# Patient Record
Sex: Female | Born: 1990 | Hispanic: No | Marital: Married | Smoking: Never smoker
Health system: Southern US, Community
[De-identification: ages and names within clinical notes are randomized; demographics above are authoritative.]

## PROBLEM LIST (undated history)

## (undated) DIAGNOSIS — E559 Vitamin D deficiency, unspecified: Secondary | ICD-10-CM

## (undated) DIAGNOSIS — R7303 Prediabetes: Secondary | ICD-10-CM

## (undated) DIAGNOSIS — E7800 Pure hypercholesterolemia, unspecified: Secondary | ICD-10-CM

## (undated) DIAGNOSIS — J45909 Unspecified asthma, uncomplicated: Secondary | ICD-10-CM

## (undated) HISTORY — DX: Pure hypercholesterolemia, unspecified: E78.00

## (undated) HISTORY — DX: Prediabetes: R73.03

## (undated) HISTORY — DX: Vitamin D deficiency, unspecified: E55.9

## (undated) HISTORY — DX: Unspecified asthma, uncomplicated: J45.909

---

## 2015-02-07 ENCOUNTER — Ambulatory Visit: Payer: 59 | Admitting: Family Medicine

## 2015-03-19 ENCOUNTER — Ambulatory Visit: Payer: 59 | Admitting: Family Medicine

## 2015-03-29 ENCOUNTER — Encounter: Payer: Self-pay | Admitting: Family Medicine

## 2015-03-29 ENCOUNTER — Ambulatory Visit (INDEPENDENT_AMBULATORY_CARE_PROVIDER_SITE_OTHER): Payer: 59 | Admitting: Family Medicine

## 2015-03-29 VITALS — BP 123/76 | HR 87 | Temp 98.0°F | Resp 16 | Ht 62.0 in | Wt 156.0 lb

## 2015-03-29 DIAGNOSIS — Z0000 Encounter for general adult medical examination without abnormal findings: Secondary | ICD-10-CM | POA: Diagnosis not present

## 2015-03-29 DIAGNOSIS — E669 Obesity, unspecified: Secondary | ICD-10-CM

## 2015-03-29 LAB — CBC WITH DIFFERENTIAL/PLATELET
Basophils Absolute: 0 10*3/uL (ref 0.0–0.1)
Basophils Relative: 0 % (ref 0–1)
EOS PCT: 1 % (ref 0–5)
Eosinophils Absolute: 0.1 10*3/uL (ref 0.0–0.7)
HEMATOCRIT: 39.7 % (ref 36.0–46.0)
HEMOGLOBIN: 13.4 g/dL (ref 12.0–15.0)
LYMPHS ABS: 4 10*3/uL (ref 0.7–4.0)
LYMPHS PCT: 34 % (ref 12–46)
MCH: 27.7 pg (ref 26.0–34.0)
MCHC: 33.8 g/dL (ref 30.0–36.0)
MCV: 82.2 fL (ref 78.0–100.0)
MONO ABS: 0.7 10*3/uL (ref 0.1–1.0)
MONOS PCT: 6 % (ref 3–12)
MPV: 8.9 fL (ref 8.6–12.4)
Neutro Abs: 7 10*3/uL (ref 1.7–7.7)
Neutrophils Relative %: 59 % (ref 43–77)
Platelets: 482 10*3/uL — ABNORMAL HIGH (ref 150–400)
RBC: 4.83 MIL/uL (ref 3.87–5.11)
RDW: 13.4 % (ref 11.5–15.5)
WBC: 11.8 10*3/uL — AB (ref 4.0–10.5)

## 2015-03-29 LAB — COMPLETE METABOLIC PANEL WITH GFR
ALT: 48 U/L — AB (ref 6–29)
AST: 31 U/L — AB (ref 10–30)
Albumin: 4.2 g/dL (ref 3.6–5.1)
Alkaline Phosphatase: 84 U/L (ref 33–115)
BUN: 6 mg/dL — AB (ref 7–25)
CALCIUM: 9.2 mg/dL (ref 8.6–10.2)
CHLORIDE: 103 mmol/L (ref 98–110)
CO2: 24 mmol/L (ref 20–31)
CREATININE: 0.69 mg/dL (ref 0.50–1.10)
GFR, Est African American: >89 mL/min (ref >=60)
GFR, Est Non African American: >89 mL/min (ref >=60)
GLUCOSE: 96 mg/dL (ref 65–99)
Potassium: 4.2 mmol/L (ref 3.5–5.3)
SODIUM: 139 mmol/L (ref 135–146)
Total Bilirubin: 0.4 mg/dL (ref 0.2–1.2)
Total Protein: 6.6 g/dL (ref 6.1–8.1)

## 2015-03-29 LAB — TSH: TSH: 2.929 u[IU]/mL (ref 0.350–4.500)

## 2015-03-29 NOTE — Progress Notes (Signed)
Patient ID: Misty Stevens, female   DOB: [birth year], 24 y.o.   MRN: [id number]   Misty Stevens, is a 23 y.o. female  [id number]  [id number]  DOB - 1990-10-28  CC:  Chief Complaint  Patient presents with  . Establish Care       HPI: Misty Stevens is a 24 y.o. female here to establish care. She denies chronic illness. Her only medication is OTC fish oil. She reports never being sexually active and has never had a PAP smear or breast exam at 24. There is no FH of early breast cancer. She has never smoked, has rare alchol and does not use illicit drugs.   No Known Allergies History reviewed. No pertinent past medical history. No current outpatient prescriptions on file prior to visit.   No current facility-administered medications on file prior to visit.   Family History  Problem Relation Age of Onset  . Diabetes Mother   . Hypertension Mother    Social History   Social History  . Marital Status: Single    Spouse Name: N/A  . Number of Children: N/A  . Years of Education: N/A   Occupational History  . Not on file.   Social History Main Topics  . Smoking status: Never Smoker   . Smokeless tobacco: Never Used  . Alcohol Use: No  . Drug Use: No  . Sexual Activity: Yes   Other Topics Concern  . Not on file   Social History Narrative  . No narrative on file    Review of Systems: Constitutional: Negative for fever, chills, appetite change, weight loss,  Fatigue. Skin: Negative for rashes or lesions of concern. HENT: Negative for ear pain, ear discharge.nose bleeds Eyes: Negative for pain, discharge, redness, itching and visual disturbance. Neck: Negative for pain, stiffness Respiratory: Negative for cough, shortness of breath,   Cardiovascular: Negative for chest pain, palpitations and leg swelling. Gastrointestinal: Negative for abdominal pain, nausea, vomiting, diarrhea, constipations Genitourinary: Negative for dysuria, urgency, frequency, hematuria,   Musculoskeletal: Negative for back pain, joint pain, joint  swelling, and gait problem.Negative for weakness. Neurological: Negative for dizziness, tremors, seizures, syncope,   light-headedness, numbness and headaches.  Psychiatric/Behavioral: Negative for depression, anxiety, decreased concentration, confusion   Objective:   Filed Vitals:   03/29/15 1330  BP: 123/76  Pulse: 87  Temp: 98 F (36.7 C)  Resp: 16    Physical Exam: Constitutional: Patient appears well-developed and well-nourished. No distress. HENT: Normocephalic, atraumatic, External right and left ear normal. Oropharynx is clear and moist.  Eyes: Conjunctivae and EOM are normal. PERRLA, no scleral icterus. Neck: Normal ROM. Neck supple. No lymphadenopathy, No thyromegaly. CVS: RRR, S1/S2 +, no murmurs, no gallops, no rubs Pulmonary: Effort and breath sounds normal, no stridor, rhonchi, wheezes, rales.  Abdominal: Soft. Normoactive BS,, no distension, tenderness, rebound or guarding.  Musculoskeletal: Normal range of motion. No edema and no tenderness.  Neuro: Alert.Normal muscle tone coordination. Non-focal Skin: Skin is warm and dry. No rash noted. Not diaphoretic. No erythema. No pallor. Psychiatric: Normal mood and affect. Behavior, judgment, thought content normal.  No results found for: WBC, HGB, HCT, MCV, PLT No results found for: CREATININE, BUN, NA, K, CL, CO2  No results found for: HGBA1C Lipid Panel  No results found for: CHOL, TRIG, HDL, CHOLHDL, VLDL, LDLCALC     Assessment and plan:   1. Obesity  - TSH  2. Health care maintenance  - COMPLETE METABOLIC PANEL WITH GFR - CBC with Differential - TSH -  Vitamin D 1,25 dihydroxy  In the near future for PAP smear and breast exam.   The patient was given clear instructions to go to ER or return to medical center if symptoms don't improve, worsen or new problems develop. The patient verbalized understanding.    Henrietta Hoover  FNP  03/29/2015, 2:07 PM

## 2015-03-29 NOTE — Patient Instructions (Signed)

## 2015-04-01 LAB — VITAMIN D 1,25 DIHYDROXY
Vitamin D 1, 25 (OH)2 Total: 61 pg/mL (ref 18–72)
Vitamin D2 1, 25 (OH)2: <8 pg/mL
Vitamin D3 1, 25 (OH)2: 61 pg/mL

## 2015-04-12 ENCOUNTER — Ambulatory Visit (INDEPENDENT_AMBULATORY_CARE_PROVIDER_SITE_OTHER): Payer: 59 | Admitting: Family Medicine

## 2015-04-12 VITALS — BP 119/84 | HR 73 | Temp 97.9°F | Resp 16 | Ht 62.0 in | Wt 155.0 lb

## 2015-04-12 DIAGNOSIS — Z0000 Encounter for general adult medical examination without abnormal findings: Secondary | ICD-10-CM

## 2015-05-31 NOTE — Progress Notes (Signed)
This visit was cancelled. Patient not seen

## 2015-07-04 ENCOUNTER — Ambulatory Visit: Payer: 59 | Admitting: Family Medicine

## 2016-03-19 ENCOUNTER — Ambulatory Visit
Admission: RE | Admit: 2016-03-19 | Discharge: 2016-03-19 | Disposition: A | Discharge status: Home / Self Care | Payer: No Typology Code available for payment source | Source: Ambulatory Visit | Attending: Cardiovascular Disease | Admitting: Cardiovascular Disease

## 2016-03-19 ENCOUNTER — Other Ambulatory Visit: Payer: Self-pay | Admitting: Cardiovascular Disease

## 2016-03-19 DIAGNOSIS — R0602 Shortness of breath: Secondary | ICD-10-CM

## 2016-03-19 DIAGNOSIS — R0902 Hypoxemia: Secondary | ICD-10-CM

## 2016-03-19 IMAGING — CT CT ANGIO CHEST
1 of 2 series · 18 of 30 positions shown · IV contrast (APPLIED)
Comparison: None.

ADDENDUM:
These results were called by telephone at the time of interpretation
on [DATE] at [DATE] to Dr. DESABOLLADURA , who verbally
acknowledged these results.
CLINICAL DATA: Shortness of breath for 1 week, worse today,
decreased O2 sats. History of long car trip [REDACTED].

EXAM:
CT ANGIOGRAPHY CHEST WITH CONTRAST
TECHNIQUE: Multidetector CT imaging of the chest was performed using the
standard protocol during bolus administration of intravenous
contrast. Multiplanar CT image reconstructions and MIPs were
obtained to evaluate the vascular anatomy.
CONTRAST:  80 cc Isovue 370

[Series 7: thins 1.5 b31s · axial · 0.79mm/px · z∈[-302,-53]mm · 18 of 188 slices shown]
[im 11/188  lung]
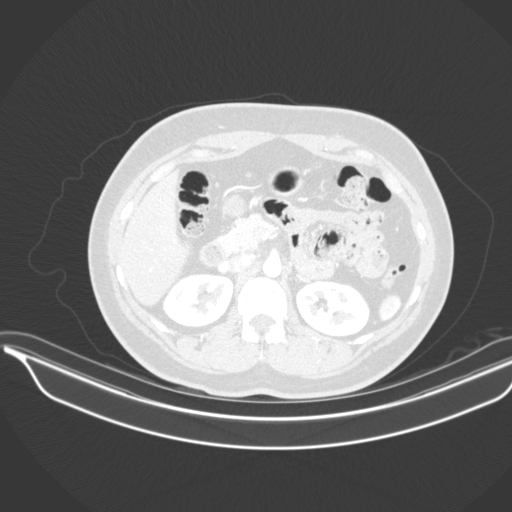
[im 21/188  mediastinal]
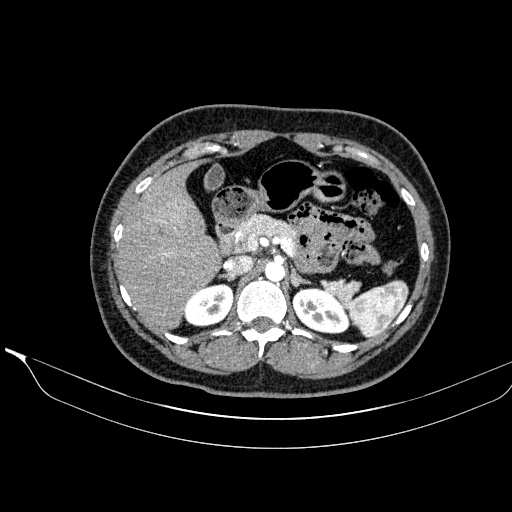
[im 32/188  lung]
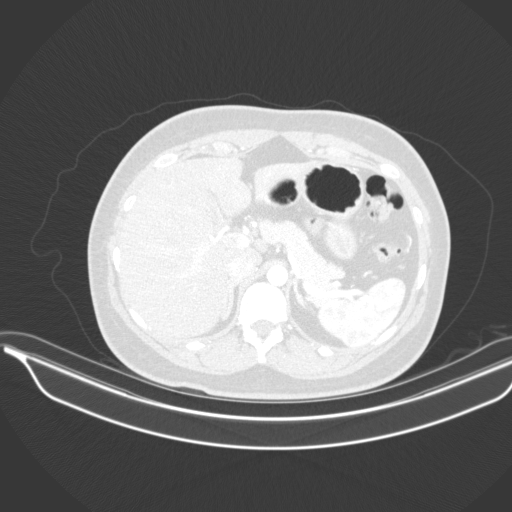
[im 42/188  mediastinal]
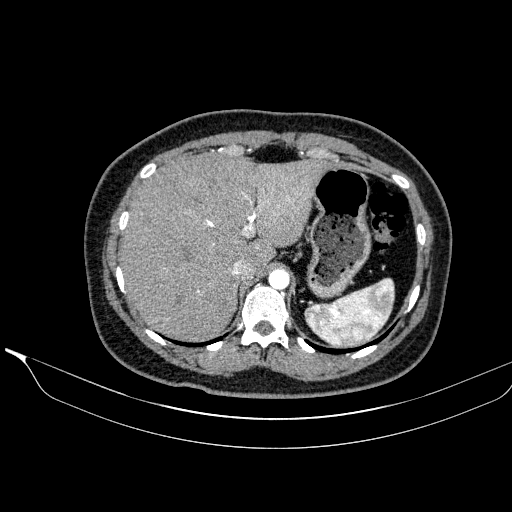
[im 52/188  lung]
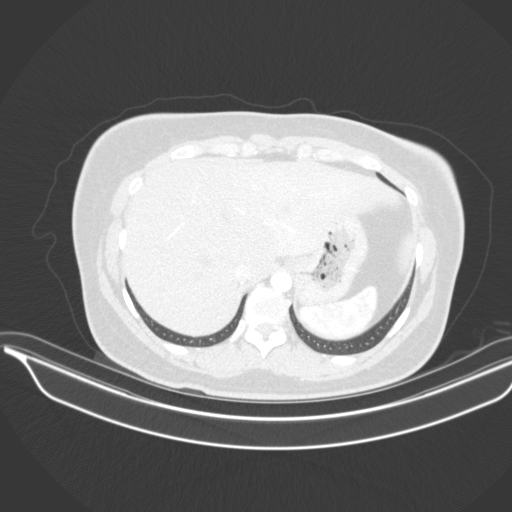
[im 63/188  mediastinal]
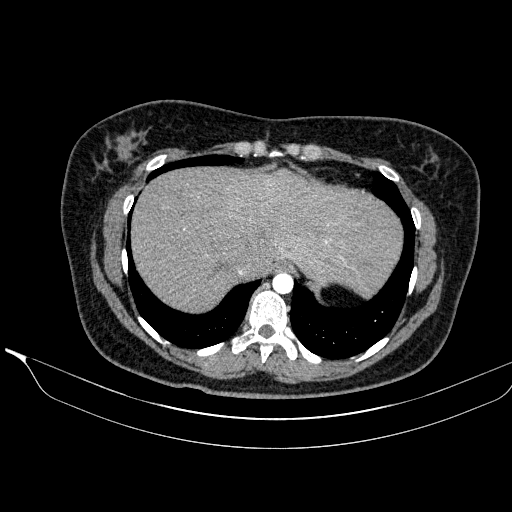
[im 73/188  lung]
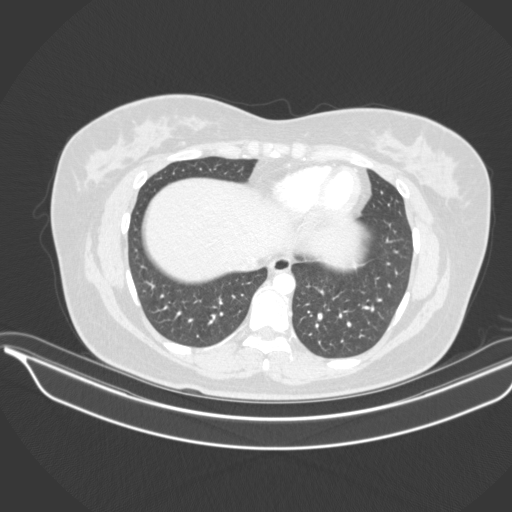
[im 84/188  mediastinal]
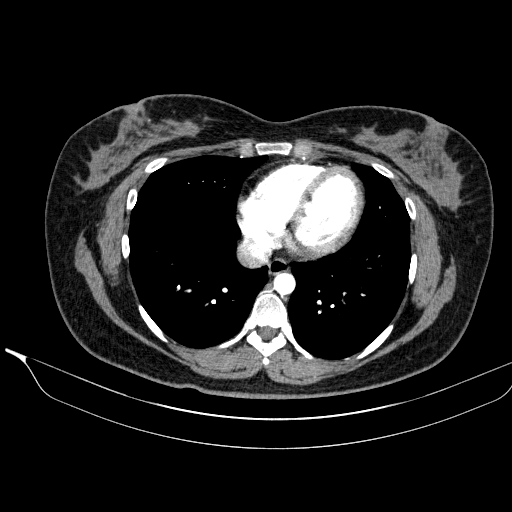
[im 88/188  lung]
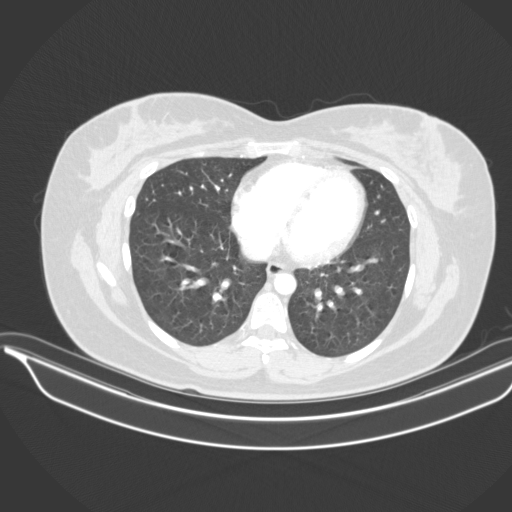
[im 94/188  mediastinal]
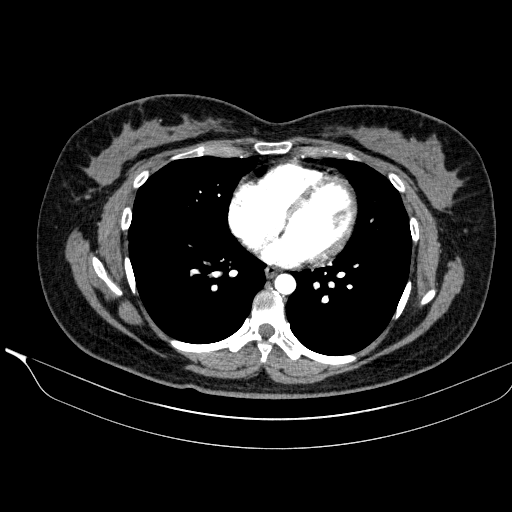
[im 104/188  lung]
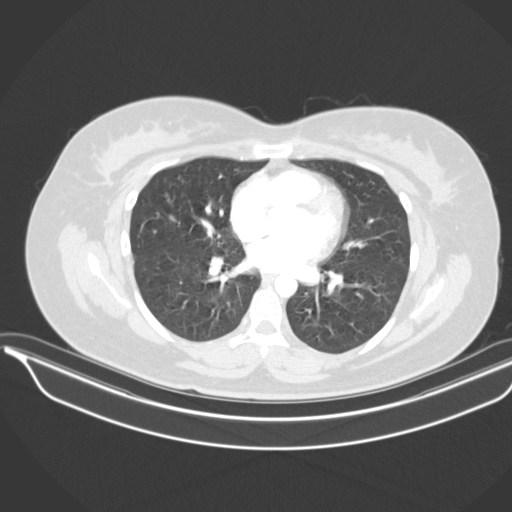
[im 115/188  mediastinal]
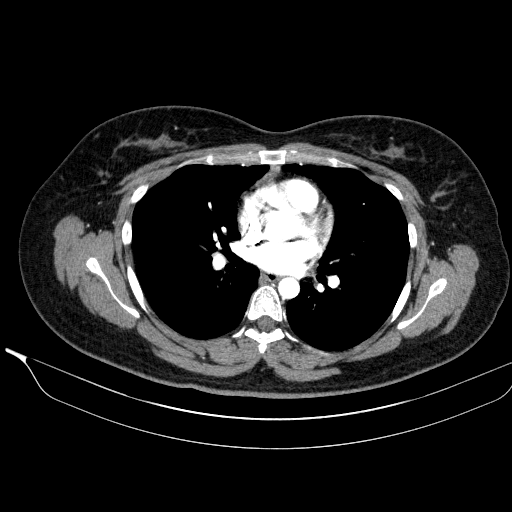
[im 125/188  lung]
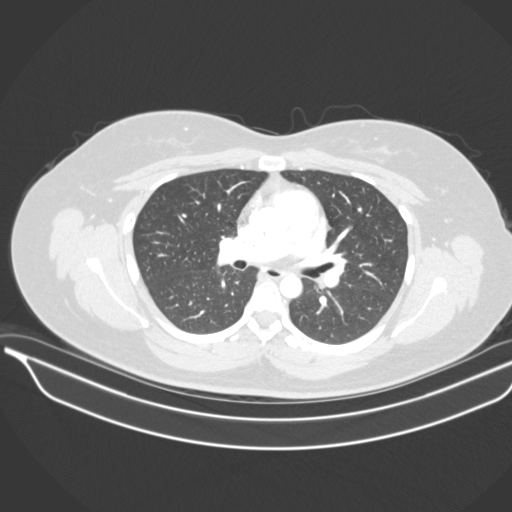
[im 136/188  mediastinal]
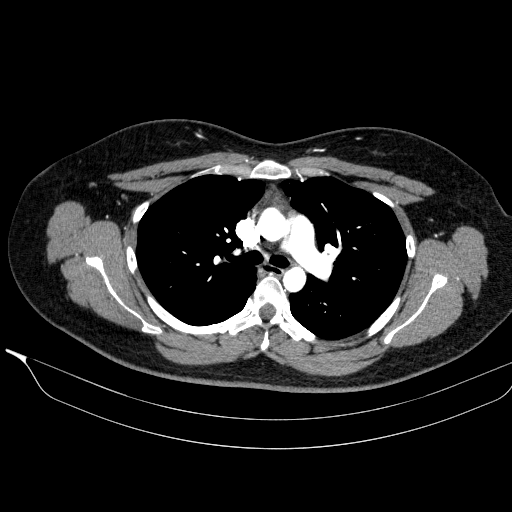
[im 146/188  lung]
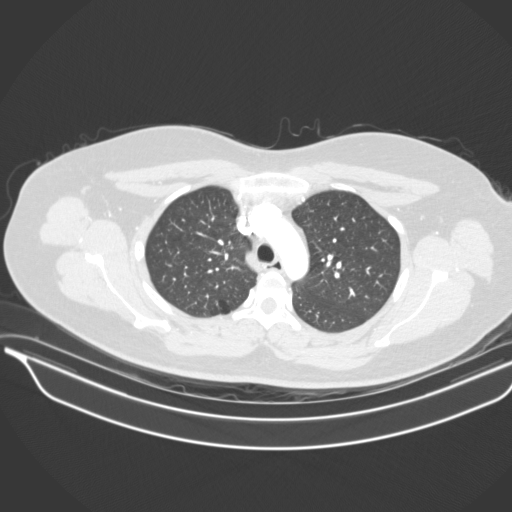
[im 156/188  mediastinal]
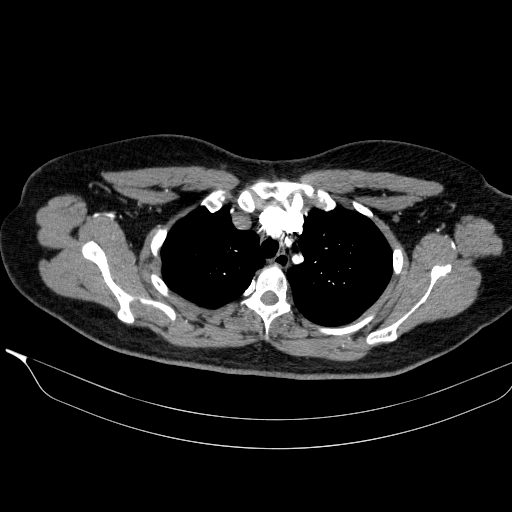
[im 167/188  lung]
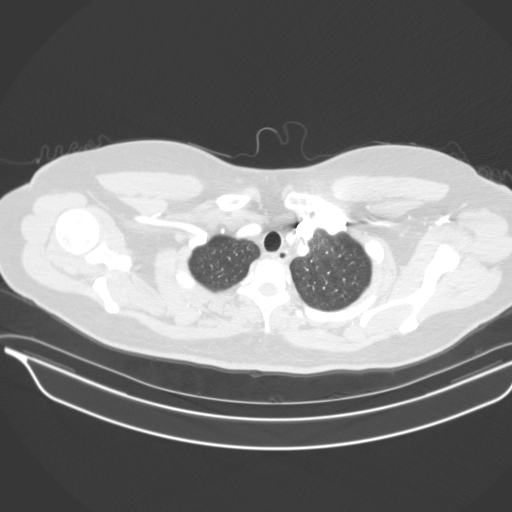
[im 177/188  mediastinal]
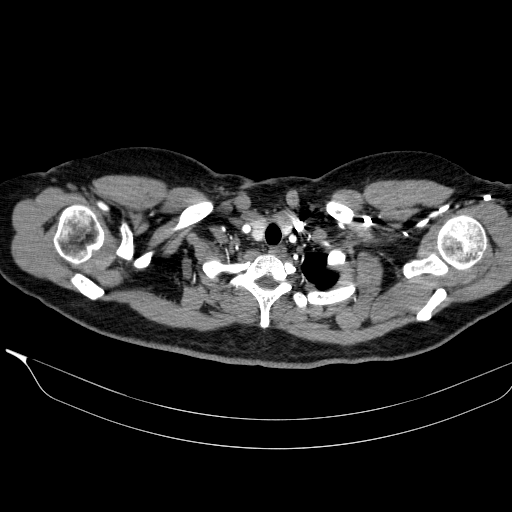

[18 of 30 positions shown; findings below may reference images not displayed]

FINDINGS: Mediastinum/Lymph Nodes: Some of the most peripheral subsegmental
pulmonary artery branches are difficult to definitively characterize
due to patient breathing motion artifact but there is no pulmonary
embolism seen within the main, lobar or central segmental pulmonary
arteries bilaterally.

Thoracic aorta is normal in caliber and configuration. No aortic
aneurysm or dissection. Heart size is normal. No pericardial
effusion.

No mass or enlarged lymph nodes within the mediastinum or perihilar
regions. Normal residual thymic tissue noted in the anterior
mediastinum. Trachea and central bronchi are unremarkable.

Lungs/Pleura: No pulmonary mass, infiltrate, or effusion.

Upper abdomen: Limited images of the upper abdomen are unremarkable.

Musculoskeletal: There is a compression deformity of the T9
vertebral body which appears chronic, with probable additional
Schmorl's node anteriorly. No acute-appearing osseous abnormality.
Superficial soft tissues are unremarkable.

Review of the MIP images confirms the above findings.
IMPRESSION: 1. No pulmonary embolism, with mild study limitations detailed
above.
2. Overall, no acute findings. Lungs are clear. Heart size is
normal.
3. Compression deformity of the T9 vertebral body which appears
chronic, approximately 30% compressed anteriorly, with associated
mild thoracic kyphosis. Any recent trauma or back pain? No
retropulsion of the vertebral body. No evidence of acute osseous
abnormality.

## 2016-03-19 MED ORDER — IOPAMIDOL (ISOVUE-370) INJECTION 76%
80.0000 mL | Freq: Once | INTRAVENOUS | Status: AC | PRN
  Administered 2016-03-19: 80 mL via INTRAVENOUS

## 2018-01-05 ENCOUNTER — Other Ambulatory Visit (HOSPITAL_COMMUNITY)
Admission: RE | Admit: 2018-01-05 | Discharge: 2018-01-05 | Disposition: A | Discharge status: Home / Self Care | Payer: BLUE CROSS/BLUE SHIELD | Source: Ambulatory Visit | Attending: Obstetrics and Gynecology | Admitting: Obstetrics and Gynecology

## 2018-01-05 ENCOUNTER — Other Ambulatory Visit: Payer: Self-pay

## 2018-01-05 ENCOUNTER — Ambulatory Visit: Payer: BLUE CROSS/BLUE SHIELD | Admitting: Obstetrics and Gynecology

## 2018-01-05 ENCOUNTER — Encounter: Payer: Self-pay | Admitting: Obstetrics and Gynecology

## 2018-01-05 VITALS — BP 100/62 | HR 70 | Resp 16 | Ht 62.75 in | Wt 145.4 lb

## 2018-01-05 DIAGNOSIS — Z01419 Encounter for gynecological examination (general) (routine) without abnormal findings: Secondary | ICD-10-CM

## 2018-01-05 DIAGNOSIS — Z124 Encounter for screening for malignant neoplasm of cervix: Secondary | ICD-10-CM | POA: Diagnosis not present

## 2018-01-05 DIAGNOSIS — Z7189 Other specified counseling: Secondary | ICD-10-CM

## 2018-01-05 DIAGNOSIS — Z23 Encounter for immunization: Secondary | ICD-10-CM

## 2018-01-05 DIAGNOSIS — N941 Unspecified dyspareunia: Secondary | ICD-10-CM | POA: Diagnosis not present

## 2018-01-05 DIAGNOSIS — Z7185 Encounter for immunization safety counseling: Secondary | ICD-10-CM

## 2018-01-05 MED ORDER — LIDOCAINE 5 % EX OINT: TOPICAL_OINTMENT | CUTANEOUS | 1 refills | Status: DC

## 2018-01-05 NOTE — Patient Instructions (Addendum)
Try lidocaine, lubrication and you control rate and depth of penetration. You can look into vaginal dilators  EXERCISE AND DIET:  We recommended that you start or continue a regular exercise program for good health. Regular exercise means any activity that makes your heart beat faster and makes you sweat.  We recommend exercising at least 30 minutes per day at least 3 days a week, preferably 4 or 5.  We also recommend a diet low in fat and sugar.  Inactivity, poor dietary choices and obesity can cause diabetes, heart attack, stroke, and kidney damage, among others.    ALCOHOL AND SMOKING:  Women should limit their alcohol intake to no more than 7 drinks/beers/glasses of wine (combined, not each!) per week. Moderation of alcohol intake to this level decreases your risk of breast cancer and liver damage. And of course, no recreational drugs are part of a healthy lifestyle.  And absolutely no smoking or even second hand smoke. Most people know smoking can cause heart and lung diseases, but did you know it also contributes to weakening of your bones? Aging of your skin?  Yellowing of your teeth and nails?  CALCIUM AND VITAMIN D:  Adequate intake of calcium and Vitamin D are recommended.  The recommendations for exact amounts of these supplements seem to change often, but generally speaking 600 mg of calcium (either carbonate or citrate) and 800 units of Vitamin D per day seems prudent. Certain women may benefit from higher intake of Vitamin D.  If you are among these women, your doctor will have told you during your visit.    PAP SMEARS:  Pap smears, to check for cervical cancer or precancers,  have traditionally been done yearly, although recent scientific advances have shown that most women can have pap smears less often.  However, every woman still should have a physical exam from her gynecologist every year. It will include a breast check, inspection of the vulva and vagina to check for abnormal growths or  skin changes, a visual exam of the cervix, and then an exam to evaluate the size and shape of the uterus and ovaries.  And after 29 years of age, a rectal exam is indicated to check for rectal cancers. We will also provide age appropriate advice regarding health maintenance, like when you should have certain vaccines, screening for sexually transmitted diseases, bone density testing, colonoscopy, mammograms, etc.   MAMMOGRAMS:  All women over 32 years old should have a yearly mammogram. Many facilities now offer a "3D" mammogram, which may cost around $50 extra out of pocket. If possible,  we recommend you accept the option to have the 3D mammogram performed.  It both reduces the number of women who will be called back for extra views which then turn out to be normal, and it is better than the routine mammogram at detecting truly abnormal areas.    COLONOSCOPY:  Colonoscopy to screen for colon cancer is recommended for all women at age 20.  We know, you hate the idea of the prep.  We agree, BUT, having colon cancer and not knowing it is worse!!  Colon cancer so often starts as a polyp that can be seen and removed at colonscopy, which can quite literally save your life!  And if your first colonoscopy is normal and you have no family history of colon cancer, most women don't have to have it again for 10 years.  Once every ten years, you can do something that may end up saving  your life, right?  We will be happy to help you get it scheduled when you are ready.  Be sure to check your insurance coverage so you understand how much it will cost.  It may be covered as a preventative service at no cost, but you should check your particular policy.       Breast Self-Awareness Breast self-awareness means being familiar with how your breasts look and feel. It involves checking your breasts regularly and reporting any changes to your health care provider. Practicing breast self-awareness is important. A change in your  breasts can be a sign of a serious medical problem. Being familiar with how your breasts look and feel allows you to find any problems early, when treatment is more likely to be successful. All women should practice breast self-awareness, including women who have had breast implants. How to do a breast self-exam One way to learn what is normal for your breasts and whether your breasts are changing is to do a breast self-exam. To do a breast self-exam: Look for Changes  1. Remove all the clothing above your waist. 2. Stand in front of a mirror in a room with good lighting. 3. Put your hands on your hips. 4. Push your hands firmly downward. 5. Compare your breasts in the mirror. Look for differences between them (asymmetry), such as: ? Differences in shape. ? Differences in size. ? Puckers, dips, and bumps in one breast and not the other. 6. Look at each breast for changes in your skin, such as: ? Redness. ? Scaly areas. 7. Look for changes in your nipples, such as: ? Discharge. ? Bleeding. ? Dimpling. ? Redness. ? A change in position. Feel for Changes  Carefully feel your breasts for lumps and changes. It is best to do this while lying on your back on the floor and again while sitting or standing in the shower or tub with soapy water on your skin. Feel each breast in the following way:  Place the arm on the side of the breast you are examining above your head.  Feel your breast with the other hand.  Start in the nipple area and make  inch (2 cm) overlapping circles to feel your breast. Use the pads of your three middle fingers to do this. Apply light pressure, then medium pressure, then firm pressure. The light pressure will allow you to feel the tissue closest to the skin. The medium pressure will allow you to feel the tissue that is a little deeper. The firm pressure will allow you to feel the tissue close to the ribs.  Continue the overlapping circles, moving downward over the  breast until you feel your ribs below your breast.  Move one finger-width toward the center of the body. Continue to use the  inch (2 cm) overlapping circles to feel your breast as you move slowly up toward your collarbone.  Continue the up and down exam using all three pressures until you reach your armpit.  Write Down What You Find  Write down what is normal for each breast and any changes that you find. Keep a written record with breast changes or normal findings for each breast. By writing this information down, you do not need to depend only on memory for size, tenderness, or location. Write down where you are in your menstrual cycle, if you are still menstruating. If you are having trouble noticing differences in your breasts, do not get discouraged. With time you will become more familiar  with the variations in your breasts and more comfortable with the exam. How often should I examine my breasts? Examine your breasts every month. If you are breastfeeding, the best time to examine your breasts is after a feeding or after using a breast pump. If you menstruate, the best time to examine your breasts is 5-7 days after your period is over. During your period, your breasts are lumpier, and it may be more difficult to notice changes. When should I see my health care provider? See your health care provider if you notice:  A change in shape or size of your breasts or nipples.  A change in the skin of your breast or nipples, such as a reddened or scaly area.  Unusual discharge from your nipples.  A lump or thick area that was not there before.  Pain in your breasts.  Anything that concerns you.  This information is not intended to replace advice given to you by your health care provider. Make sure you discuss any questions you have with your health care provider. Document Released: 07/20/2005 Document Revised: 12/26/2015 Document Reviewed: 06/09/2015 Elsevier Interactive Patient Education   2018 [employer].  Oral Contraception Use Oral contraceptive pills (OCPs) are medicines taken to prevent pregnancy. OCPs work by preventing the ovaries from releasing eggs. The hormones in OCPs also cause the cervical mucus to thicken, preventing the sperm from entering the uterus. The hormones also cause the uterine lining to become thin, not allowing a fertilized egg to attach to the inside of the uterus. OCPs are highly effective when taken exactly as prescribed. However, OCPs do not prevent sexually transmitted diseases (STDs). Safe sex practices, such as using condoms along with an OCP, can help prevent STDs. Before taking OCPs, you may have a physical exam and Pap test. Your health care provider may also order blood tests if necessary. Your health care provider will make sure you are a good candidate for oral contraception. Discuss with your health care provider the possible side effects of the OCP you may be prescribed. When starting an OCP, it can take 2 to 3 months for the body to adjust to the changes in hormone levels in your body. How to take oral contraceptive pills Your health care provider may advise you on how to start taking the first cycle of OCPs. Otherwise, you can:  Start on day 1 of your menstrual period. You will not need any backup contraceptive protection with this start time.  Start on the first Sunday after your menstrual period or the day you get your prescription. In these cases, you will need to use backup contraceptive protection for the first week.  Start the pill at any time of your cycle. If you take the pill within 5 days of the start of your period, you are protected against pregnancy right away. In this case, you will not need a backup form of birth control. If you start at any other time of your menstrual cycle, you will need to use another form of birth control for 7 days. If your OCP is the type called a minipill, it will protect you from pregnancy after taking  it for 2 days (48 hours).  After you have started taking OCPs:  If you forget to take 1 pill, take it as soon as you remember. Take the next pill at the regular time.  If you miss 2 or more pills, call your health care provider because different pills have different instructions for missed doses.  Use backup birth control until your next menstrual period starts.  If you use a 28-day pack that contains inactive pills and you miss 1 of the last 7 pills (pills with no hormones), it will not matter. Throw away the rest of the non-hormone pills and start a new pill pack.  No matter which day you start the OCP, you will always start a new pack on that same day of the week. Have an extra pack of OCPs and a backup contraceptive method available in case you miss some pills or lose your OCP pack. Follow these instructions at home:  Do not smoke.  Always use a condom to protect against STDs. OCPs do not protect against STDs.  Use a calendar to mark your menstrual period days.  Read the information and directions that came with your OCP. Talk to your health care provider if you have questions. Contact a health care provider if:  You develop nausea and vomiting.  You have abnormal vaginal discharge or bleeding.  You develop a rash.  You miss your menstrual period.  You are losing your hair.  You need treatment for mood swings or depression.  You get dizzy when taking the OCP.  You develop acne from taking the OCP.  You become pregnant. Get help right away if:  You develop chest pain.  You develop shortness of breath.  You have an uncontrolled or severe headache.  You develop numbness or slurred speech.  You develop visual problems.  You develop pain, redness, and swelling in the legs. This information is not intended to replace advice given to you by your health care provider. Make sure you discuss any questions you have with your health care provider. Document Released:  07/09/2011 Document Revised: 12/26/2015 Document Reviewed: 01/08/2013 Elsevier Interactive Patient Education  2017 [employer]

## 2018-01-05 NOTE — Progress Notes (Signed)
27 y.o. G0P0000 SingleAsianF here for annual exam.   Period Cycle (Days): 30 Period Duration (Days): 6-7 days Period Pattern: Regular Menstrual Control: Maxi pad Menstrual Control Change Freq (Hours): every 6-8 hours on heaviest day Dysmenorrhea: None  Never sexually active, just got married, has tried to have sex, too painful. Unable to penetrate.   Patient's last menstrual period was 12/26/2017 (exact date).          Sexually active: No.  The current method of family planning is abstinence.    Exercising: No.  no Smoker:  no  Health Maintenance: Pap:  Never History of abnormal Pap:  n/a MMG:  n/a Colonoscopy:  n/a BMD:   n/a TDaP:  never Gardasil: no   reports that she has never smoked. She has never used smokeless tobacco. She reports that she does not drink alcohol or use drugs.  Past Medical History:  Diagnosis Date  . Asthma   . Hypercholesteremia    --monitored by diet  . Pre-diabetes   . Vitamin D deficiency   Just found out about pre-diabetes and elevated lipids recently, has f/u with primary this week.   No past surgical history on file.  Current Outpatient Medications  Medication Sig Dispense Refill  . vitamin B-12 (CYANOCOBALAMIN) 1000 MCG tablet Take 1,000 mcg by mouth daily.    . Vitamin D, Ergocalciferol, (DRISDOL) 50000 units CAPS capsule Take 50,000 Units by mouth every 7 (seven) days.     No current facility-administered medications for this visit.     Family History  Problem Relation Age of Onset  . Diabetes Mother   . Hypertension Mother     Review of Systems  Constitutional: Negative.   HENT: Negative.   Eyes: Negative.   Respiratory: Negative.   Cardiovascular: Negative.   Gastrointestinal: Negative.   Endocrine: Negative.   Genitourinary: Negative.   Musculoskeletal: Negative.   Skin: Negative.   Allergic/Immunologic: Negative.   Neurological: Negative.   Psychiatric/Behavioral: Negative.     Exam:   BP 100/62 (BP Location:  Right Arm, Patient Position: Sitting, Cuff Size: Normal)   Pulse 70   Resp 16   Ht 5' 2.75" (1.594 m)   Wt 145 lb 6.4 oz (66 kg)   LMP 12/26/2017 (Exact Date)   BMI 25.96 kg/m   Weight change: @WEIGHTCHANGE @ Height:   Height: 5' 2.75" (159.4 cm)  Ht Readings from Last 3 Encounters:  01/05/18 5' 2.75" (1.594 m)  04/12/15 5\' 2"  (1.575 m)  03/29/15 5\' 2"  (1.575 m)    General appearance: alert, cooperative and appears stated age Head: Normocephalic, without obvious abnormality, atraumatic Neck: no adenopathy, supple, symmetrical, trachea midline and thyroid normal to inspection and palpation Lungs: clear to auscultation bilaterally Cardiovascular: regular rate and rhythm Breasts: normal appearance, no masses or tenderness Abdomen: soft, non-tender; non distended,  no masses,  no organomegaly Extremities: extremities normal, atraumatic, no cyanosis or edema Skin: Skin color, texture, turgor normal. No rashes or lesions Lymph nodes: Cervical, supraclavicular, and axillary nodes normal. No abnormal inguinal nodes palpated Neurologic: Grossly normal   Pelvic: External genitalia:  no lesions, no hymenal remnants. Not tender at the vestibule              Urethra:  normal appearing urethra with no masses, tenderness or lesions              Bartholins and Skenes: normal                 Vagina: normal appearing vagina  with normal color and discharge, no lesions, able to insert a pediatric speculum and one finger              Cervix: no lesions               Bimanual Exam:  Uterus:  no masses or tenderness              Adnexa: no mass, fullness, tenderness               Rectovaginal: Confirms               Anus:  normal sphincter tone, no lesions  Chaperone was present for exam.  A:  Well Woman with normal exam  Pre-diabetes  Elevated lipids  Severe dyspareunia, unable to be sexually active. No signs of vulvodynia, no obstruction.   P:   Labs with primary  Discussed breast self  exam  Discussed calcium and vit D intake  Plans to use condoms, discussed the option of contraception.  No contraindications to OCPs, risks reviewed, she will call if she wants to start it  Lidocaine ointment for use with intercourse  She should control the rate and depth of penetration, use lubrication  We discussed the option of vaginal dilators, she will let me know if she continues to have problems.   Start gardasil series  TDAP

## 2018-01-07 LAB — CYTOLOGY - PAP
Chlamydia: NEGATIVE
DIAGNOSIS: NEGATIVE
Neisseria Gonorrhea: NEGATIVE

## 2018-01-14 ENCOUNTER — Telehealth: Payer: Self-pay | Admitting: Obstetrics and Gynecology

## 2018-01-14 NOTE — Telephone Encounter (Signed)
Left message to call Julann Mcgilvray at [phone].  

## 2018-01-14 NOTE — Telephone Encounter (Signed)
Patient states she received an injection in both of her arms on 01/05/2018. Both of her arms started hurting her on 01/11/18. She states both arms hurt and she cannot sleep. Patient wants to know if she should be concerned.  Also states lidocaine prescription is not covered by her insurance and she would like to know if there is an alternative.   Please advise.

## 2018-01-20 NOTE — Telephone Encounter (Signed)
Left message to call Lavanna Rog at [phone].  

## 2018-01-20 NOTE — Telephone Encounter (Signed)
Spoke with patient.   1. Gardasil and Tdap received on 01/05/18. Reports pain in both arms at injection site initially. Left arm pain has since resolved, still present in right upper arm at injection site. Denies redness, swelling, tenderness, itching, SOB. Can move both upper extremities. Applying ice and taking tylenol prn. Offered OV for further evaluation, patient declined. Recommended prn po motrin and apply heat, if symptoms do not resolve or new symptoms develop, OV recommended.   2. Patient asking for alternative to lidocaine 5%, not covered by insurance. Advised lidocaine can be purchased OTC. Patient to f/u with pharmacy for assistance with available alternatives.   Routing to provider for final review. Patient is agreeable to disposition. Will close encounter.

## 2018-03-08 ENCOUNTER — Other Ambulatory Visit: Payer: Self-pay

## 2018-03-08 ENCOUNTER — Ambulatory Visit (INDEPENDENT_AMBULATORY_CARE_PROVIDER_SITE_OTHER): Payer: BLUE CROSS/BLUE SHIELD | Admitting: *Deleted

## 2018-03-08 VITALS — BP 102/68 | HR 78 | Resp 14 | Ht 62.75 in | Wt 148.5 lb

## 2018-03-08 DIAGNOSIS — Z23 Encounter for immunization: Secondary | ICD-10-CM | POA: Diagnosis not present

## 2018-03-08 NOTE — Progress Notes (Signed)
Patient in today for second Gardasil injection.   Contraception: none- reviewed with nursing supervisor, okay to give LMP: 02-19-18  Last AEX: 01-05-18 with Dr. Oscar LaJertson   Injection given in left deltoid. Patient tolerated shot well.   Patient informed next injection due in about four months. (after 07-08-18).  Routed to provider for final review.  Encounter closed.

## 2018-06-01 ENCOUNTER — Telehealth: Payer: Self-pay | Admitting: Obstetrics and Gynecology

## 2018-06-01 NOTE — Telephone Encounter (Signed)
Spoke with patient. Patient reports dysuria for 2-3 wks. LMP 05/08/18, cycles are regular. Denies any other urinary symptoms, fever/chills, pelvic pain, irregular bleeding, N/V, odor. Recommended OV for further evaluation, scheduled for 06/07/18 at 3:15pm. Patient declined earlier appointments offered. Advised patient should new symptoms develop or symptoms worsen, return call to office for earlier OV.   Routing to provider for final review. Patient is agreeable to disposition. Will close encounter.

## 2018-06-01 NOTE — Telephone Encounter (Signed)
Patient is experiencing painful urination and would like to speak with a nurse.

## 2018-06-01 NOTE — Telephone Encounter (Signed)
Left message to call Danialle Dement, RN at [facility] [phone].   

## 2018-06-06 NOTE — Progress Notes (Signed)
GYNECOLOGY  VISIT   HPI: 27 y.o.   Single Unavailable Not Hispanic or Latino  female   G0P0000 with Patient's last menstrual period was 05/08/2018 (exact date).   here for dysuria. She c/o a one month h/o dysuria after sex. No sex in the last week, currently she feels better. She does c/o urinary frequency and urgency, but feels the dysuria has improved in the last week. No abnormal vaginal discharge.  She was seen at her annual exam in 6/19, at that time was not able to be sexually active. She has been using lidocaine ointment and lubrication, which helps. Her husband is able to penetrate 1/2 way. Using w/d for contraception, okay if she gets pregnant.     Her current cycle is late, expected it last week.   She c/o rectal bleeding when she wiped after BM today. It has occurred before, seems to occur when she eats spicy foods. Today her stool was hard.    GYNECOLOGIC HISTORY: Patient's last menstrual period was 05/08/2018 (exact date). Contraception: None Menopausal hormone therapy: None        OB History    Gravida  0   Para  0   Term  0   Preterm  0   AB  0   Living  0     SAB  0   TAB  0   Ectopic  0   Multiple  0   Live Births  0              There are no active problems to display for this patient.   Past Medical History:  Diagnosis Date  . Asthma   . Hypercholesteremia    --monitored by diet  . Pre-diabetes   . Vitamin D deficiency     History reviewed. No pertinent surgical history.  Current Outpatient Medications  Medication Sig Dispense Refill  . lidocaine (XYLOCAINE) 5 % ointment Apply a pea sized amount topically 20 minutes prior to intercourse, then gently wipe it off. 30 g 1  . vitamin B-12 (CYANOCOBALAMIN) 1000 MCG tablet Take 1,000 mcg by mouth daily.    . Vitamin D, Ergocalciferol, (DRISDOL) 50000 units CAPS capsule Take 50,000 Units by mouth every 7 (seven) days.     No current facility-administered medications for this visit.       ALLERGIES: Patient has no known allergies.  Family History  Problem Relation Age of Onset  . Diabetes Mother   . Hypertension Mother     Social History   Socioeconomic History  . Marital status: Single    Spouse name: Not on file  . Number of children: Not on file  . Years of education: Not on file  . Highest education level: Not on file  Occupational History  . Not on file  Social Needs  . Financial resource strain: Not on file  . Food insecurity:    Worry: Not on file    Inability: Not on file  . Transportation needs:    Medical: Not on file    Non-medical: Not on file  Tobacco Use  . Smoking status: Never Smoker  . Smokeless tobacco: Never Used  Substance and Sexual Activity  . Alcohol use: No  . Drug use: No  . Sexual activity: Yes    Birth control/protection: None  Lifestyle  . Physical activity:    Days per week: Not on file    Minutes per session: Not on file  . Stress: Not on file  Relationships  .  Social connections:    Talks on phone: Not on file    Gets together: Not on file    Attends religious service: Not on file    Active member of club or organization: Not on file    Attends meetings of clubs or organizations: Not on file    Relationship status: Not on file  . Intimate partner violence:    Fear of current or ex partner: Not on file    Emotionally abused: Not on file    Physically abused: Not on file    Forced sexual activity: Not on file  Other Topics Concern  . Not on file  Social History Narrative  . Not on file    Review of Systems  Constitutional:       Weight gain  HENT: Negative.   Eyes: Negative.   Respiratory: Negative.   Cardiovascular: Negative.   Gastrointestinal: Negative.   Genitourinary: Positive for dysuria.  Musculoskeletal: Negative.   Skin: Negative.   Neurological: Negative.   Endo/Heme/Allergies: Negative.   Psychiatric/Behavioral: Negative.     PHYSICAL EXAMINATION:    BP 122/88 (BP Location: Right  Arm, Patient Position: Sitting, Cuff Size: Normal)   Pulse 72   Wt 153 lb 12.8 oz (69.8 kg)   LMP 05/08/2018 (Exact Date)   BMI 27.46 kg/m     General appearance: alert, cooperative and appears stated age Abdomen: soft, non-tender; non distended, no masses,  no organomegaly CVA: not tender  Pelvic: External genitalia:  no lesions, not tender at the vestibule.               Urethra:  normal appearing urethra with no masses, tenderness or lesions              Bartholins and Skenes: normal                 Vagina: normal appearing vagina with normal color and discharge, no lesions. Able to insert a pediatric speculum, slightly uncomfortable with opening the speculum. Able to insert one finger without discomfort.               Cervix: no cervical motion tenderness and no lesions              Bimanual Exam:  Uterus:  normal size, contour, position, consistency, mobility, non-tender              Adnexa: no mass, fullness, tenderness   Bladder: mildly tender to palpation  Anus: fissure and tag noted at 6 o'clock               Chaperone was present for exam.  Urine dip: +blood, +leuk  ASSESSMENT UTI symptoms, occurred after having sex Dyspareunia, improved with lubricant and lidocaine, partner still only able to penetrate 1/2 way Anal fissure    PLAN Send urine for ua, c&s Continue to use lidocaine ointment and lubricant with intercourse Void after intercourse We discussed the option of vaginal dilators Discussed treatment of anal fissures Call if not improving   An After Visit Summary was printed and given to the patient.  ~25 minutes face to face time of which over 50% was spent in counseling.

## 2018-06-07 ENCOUNTER — Ambulatory Visit: Payer: BLUE CROSS/BLUE SHIELD | Admitting: Obstetrics and Gynecology

## 2018-06-07 ENCOUNTER — Encounter: Payer: Self-pay | Admitting: Obstetrics and Gynecology

## 2018-06-07 ENCOUNTER — Other Ambulatory Visit: Payer: Self-pay

## 2018-06-07 VITALS — BP 122/88 | HR 72 | Wt 153.8 lb

## 2018-06-07 DIAGNOSIS — R3915 Urgency of urination: Secondary | ICD-10-CM | POA: Diagnosis not present

## 2018-06-07 DIAGNOSIS — K602 Anal fissure, unspecified: Secondary | ICD-10-CM | POA: Diagnosis not present

## 2018-06-07 DIAGNOSIS — R300 Dysuria: Secondary | ICD-10-CM | POA: Diagnosis not present

## 2018-06-07 DIAGNOSIS — R350 Frequency of micturition: Secondary | ICD-10-CM

## 2018-06-07 LAB — POCT URINALYSIS DIPSTICK
Bilirubin, UA: NEGATIVE
Blood, UA: POSITIVE
Glucose, UA: NEGATIVE
KETONES UA: NEGATIVE
NITRITE UA: NEGATIVE
PROTEIN UA: NEGATIVE
Spec Grav, UA: 1.01 (ref 1.010–1.025)
Urobilinogen, UA: 0.2 E.U./dL
pH, UA: 5 (ref 5.0–8.0)

## 2018-06-07 MED ORDER — PHENAZOPYRIDINE HCL 200 MG PO TABS: 200.0000 mg | ORAL_TABLET | Freq: Three times a day (TID) | ORAL | 0 refills | Status: DC | PRN

## 2018-06-07 MED ORDER — SULFAMETHOXAZOLE-TRIMETHOPRIM 800-160 MG PO TABS: 1.0000 | ORAL_TABLET | Freq: Two times a day (BID) | ORAL | 0 refills | Status: DC

## 2018-06-07 NOTE — Patient Instructions (Addendum)
Start pre-natal vitamins  For the anal fissure you can try metamucil, if that doesn't work you can try senna (as directed). You can take metamucil long term, you should limit the senna to a couple of months.   Anal Fissure, Adult An anal fissure is a small tear or crack in the skin around the anus. Bleeding from a fissure usually stops on its own within a few minutes. However, bleeding will often occur again with each bowel movement until the crack heals. What are the causes? This condition may be caused by:  Passing large, hard stool (feces).  Frequent diarrhea.  Constipation.  Inflammatory bowel disease (Crohn disease or ulcerative colitis).  Infections.  Anal sex.  What are the signs or symptoms? Symptoms of this condition include:  Bleeding from the rectum.  Small amounts of blood seen on your stool, on toilet paper, or in the toilet after a bowel movement.  Painful bowel movements.  Itching or irritation around the anus.  How is this diagnosed? A health care provider may diagnose this condition by closely examining the anal area. An anal fissure can usually be seen with careful inspection. In some cases, a rectal exam may be performed, or a short tube (anoscope) may be used to examine the anal canal. How is this treated? Treatment for this condition may include:  Taking steps to avoid constipation. This may include making changes to your diet, such as increasing your intake of fiber or fluid.  Taking fiber supplements. These supplements can soften your stool to help make bowel movements easier. Your health care provider may also prescribe a stool softener if your stool is often hard.  Taking sitz baths. This may help to heal the tear.  Using medicated creams or ointments. These may be prescribed to lessen discomfort.  Follow these instructions at home: Eating and drinking  Avoid foods that may be constipating, such as bananas and dairy products.  Drink enough  fluid to keep your urine clear or pale yellow.  Maintain a diet that is high in fruits, whole grains, and vegetables. General instructions  Keep the anal area as clean and dry as possible.  Take sitz baths as told by your health care provider. Do not use soap in the sitz baths.  Take over-the-counter and prescription medicines only as told by your health care provider.  Use creams or ointments only as told by your health care provider.  Keep all follow-up visits as told by your health care provider. This is important. Contact a health care provider if:  You have more bleeding.  You have a fever.  You have diarrhea that is mixed with blood.  You continue to have pain.  Your problem is getting worse rather than better. This information is not intended to replace advice given to you by your health care provider. Make sure you discuss any questions you have with your health care provider. Document Released: 07/20/2005 Document Revised: 11/27/2015 Document Reviewed: 10/15/2014 Elsevier Interactive Patient Education  2018 [employer].  Urinary Tract Infection, Adult A urinary tract infection (UTI) is an infection of any part of the urinary tract. The urinary tract includes the:  Kidneys.  Ureters.  Bladder.  Urethra.  These organs make, store, and get rid of pee (urine) in the body. Follow these instructions at home:  Take over-the-counter and prescription medicines only as told by your doctor.  If you were prescribed an antibiotic medicine, take it as told by your doctor. Do not stop taking the  antibiotic even if you start to feel better.  Avoid the following drinks: ? Alcohol. ? Caffeine. ? Tea. ? Carbonated drinks.  Drink enough fluid to keep your pee clear or pale yellow.  Keep all follow-up visits as told by your doctor. This is important.  Make sure to: ? Empty your bladder often and completely. Do not to hold pee for long periods of time. ? Empty your  bladder before and after sex. ? Wipe from front to back after a bowel movement if you are female. Use each tissue one time when you wipe. Contact a doctor if:  You have back pain.  You have a fever.  You feel sick to your stomach (nauseous).  You throw up (vomit).  Your symptoms do not get better after 3 days.  Your symptoms go away and then come back. Get help right away if:  You have very bad back pain.  You have very bad lower belly (abdominal) pain.  You are throwing up and cannot keep down any medicines or water. This information is not intended to replace advice given to you by your health care provider. Make sure you discuss any questions you have with your health care provider. Document Released: 01/06/2008 Document Revised: 12/26/2015 Document Reviewed: 06/10/2015 Elsevier Interactive Patient Education  [employer].

## 2018-06-09 ENCOUNTER — Telehealth: Payer: Self-pay | Admitting: Emergency Medicine

## 2018-06-09 LAB — URINE CULTURE

## 2018-06-09 MED ORDER — NITROFURANTOIN MONOHYD MACRO 100 MG PO CAPS: 100.0000 mg | ORAL_CAPSULE | Freq: Two times a day (BID) | ORAL | 0 refills | Status: AC

## 2018-06-09 NOTE — Telephone Encounter (Signed)
-----   Message from Romualdo Bolk, MD sent at 06/09/2018  4:00 PM EST ----- The patient has a UTI, it is resistant to the Bactrim she was started on. Please change her to macrobid 100 mg BID x 5 days.

## 2018-06-09 NOTE — Telephone Encounter (Signed)
Message left to return call to [place] at [phone].  Detailed message left as office is closed. Okay per designated party release form.   Will call again to confirm patient understands results and new instructions.  Rx for macrobid sent.

## 2018-06-10 LAB — URINALYSIS, MICROSCOPIC ONLY
Bacteria, UA: NONE SEEN
CASTS: NONE SEEN /LPF

## 2018-06-10 NOTE — Telephone Encounter (Signed)
Call to patient. Call immediately goes to voicemail. Message left to return my call.  No other number available.  No mychart.  Call to [facility], patient has not picked up Rx. It is ready for her to pick up.  Call to husband listed on designated party release form.  Call immediately goes to voicemail. Left message asking to please ask patient to listen to her voicemail, call office or pick up Rx from [facility].   Call to Sister, Nicolemarie Wooley and left message asking to request if can contact patient ask her to listen to voicemail from yesterday or contact her doctors office.

## 2018-06-13 NOTE — Telephone Encounter (Signed)
Message left to return call to Faith Patricelli at [phone].    

## 2018-06-14 NOTE — Telephone Encounter (Signed)
Called to patient and left message to return my call.  Call to husband immediately goes to voicemail.  Called [facility] and patient has picked up Macrobid.

## 2018-06-20 NOTE — Telephone Encounter (Signed)
Returned call to patient. Patient calling to clarify antibiotics. Patient had an appointment 11/5 and states she had to leave to go [place] day. Just returned last night. Patient states she has not taken any antibiotics yet. Sister picked up prescriptions for her and she is not sure which one she needs to take. RN advised patient needs to take macrobid twice a day for 5 days. Patient verbalized understanding. Patient states she is still having some burning. RN advised to take medication as prescribed and return call if symptoms do not resolve after treatment. Patient agreeable.   Routing to provider and will close encounter.

## 2018-06-20 NOTE — Telephone Encounter (Signed)
Patient left voicemail over lunch returning call to Tracy. °

## 2018-06-22 ENCOUNTER — Telehealth: Payer: Self-pay | Admitting: Obstetrics and Gynecology

## 2018-06-22 NOTE — Telephone Encounter (Signed)
Patient left voicemail this morning requesting to speak to a nurse regarding questions that she has about a medication.

## 2018-06-22 NOTE — Telephone Encounter (Signed)
Spoke with patient. Patient picked up 3 prescriptions from pharmacy on 11/19, calling to confirm medications and instructions. Picked up bactrim DS, Macrobid and pyridium. Patient never started bactrim, started macrobid and pyridium 11/19.  Reviewed medications with patient. Advised patient not to take bactrim, continue macrobid until completed. Return call to office if symptoms do not resolve. Questions answered.   Routing to provider for final review. Patient is agreeable to disposition. Will close encounter.

## 2018-06-22 NOTE — Telephone Encounter (Signed)
Left message to call Thaine Garriga, RN at [facility] [phone].   

## 2018-06-22 NOTE — Telephone Encounter (Signed)
Patient returned call

## 2018-06-22 NOTE — Telephone Encounter (Signed)
Left message to call Ai Sonnenfeld, RN at [facility] [phone].   May speak with any available triage nurse.  

## 2018-07-08 ENCOUNTER — Other Ambulatory Visit: Payer: Self-pay

## 2018-07-08 ENCOUNTER — Ambulatory Visit (INDEPENDENT_AMBULATORY_CARE_PROVIDER_SITE_OTHER): Payer: BLUE CROSS/BLUE SHIELD | Admitting: *Deleted

## 2018-07-08 VITALS — BP 110/74 | HR 72 | Resp 14 | Ht 63.0 in | Wt 154.5 lb

## 2018-07-08 DIAGNOSIS — Z23 Encounter for immunization: Secondary | ICD-10-CM

## 2018-07-08 NOTE — Progress Notes (Signed)
Patient in today for third Gardasil injection.   Contraception: none LMP: 07-06-18 on cycle today Last AEX: 01-05-18 with Dr. Oscar LaJertson   Injection given in right arm. Patient tolerated shot well.   Patient informed she has completed the series.  Routed to provider for final review.  Encounter closed.

## 2018-09-16 ENCOUNTER — Telehealth: Payer: Self-pay | Admitting: Obstetrics and Gynecology

## 2018-09-16 NOTE — Telephone Encounter (Signed)
Spoke with patient. Reports external and internal vaginal itching, burning and dry flaky skin. Started last week. Denies vaginal odor, d/c, or urinary symptoms. LMP 09/05/18. Recommended OV for further evaluation, OV scheduled for 2/18 at 3:15pm with Dr. Oscar La, patient declined earlier appts offered. Recommended applying coconut oil externally for relief, keep OV as scheduled.   Routing to provider for final review. Patient is agreeable to disposition. Will close encounter.

## 2018-09-16 NOTE — Telephone Encounter (Signed)
Patient called and stated that she is experiencing itching and burning and "dandruff like dry skin." Patient stated that this has been going on since last week. Offered patient appointment today with another provider. Patient declined and requested to make appointment for next week with Dr. Oscar La.

## 2018-09-20 ENCOUNTER — Telehealth: Payer: Self-pay | Admitting: Obstetrics and Gynecology

## 2018-09-20 ENCOUNTER — Ambulatory Visit: Payer: Self-pay | Admitting: Obstetrics and Gynecology

## 2018-09-20 NOTE — Telephone Encounter (Signed)
Patient called and cancelled her appointment for today with Dr. Oscar La for vaginal burning and itching because she said she is feeling better and doesn't need the appointment. Patient will call back to reschedule if needed.

## 2019-01-19 ENCOUNTER — Ambulatory Visit: Payer: BLUE CROSS/BLUE SHIELD | Admitting: Obstetrics and Gynecology

## 2019-01-25 ENCOUNTER — Ambulatory Visit: Payer: BC Managed Care – PPO | Admitting: Obstetrics and Gynecology

## 2019-01-30 NOTE — Progress Notes (Deleted)
28 y.o. G0P0000 Single Unavailable Not Hispanic or Latino female here for annual exam.      No LMP recorded.          Sexually active: {yes no:314532}  The current method of family planning is {contraception:315051}.    Exercising: {yes no:314532}  {types:19826} Smoker:  {YES [id number]:22349}  Health Maintenance: Pap:  01/05/2018 WNL History of abnormal Pap:  No MMG:  n/a Colonoscopy:  n/a BMD:   n/a TDaP:  01/05/2018 Gardasil: Completed all 3   reports that she has never smoked. She has never used smokeless tobacco. She reports that she does not drink alcohol or use drugs.  Past Medical History:  Diagnosis Date  . Asthma   . Hypercholesteremia    --monitored by diet  . Pre-diabetes   . Vitamin D deficiency     No past surgical history on file.  Current Outpatient Medications  Medication Sig Dispense Refill  . lidocaine (XYLOCAINE) 5 % ointment Apply a pea sized amount topically 20 minutes prior to intercourse, then gently wipe it off. 30 g 1  . phenazopyridine (PYRIDIUM) 200 MG tablet Take 1 tablet (200 mg total) by mouth 3 (three) times daily as needed. 6 tablet 0  . vitamin B-12 (CYANOCOBALAMIN) 1000 MCG tablet Take 1,000 mcg by mouth daily.    . Vitamin D, Ergocalciferol, (DRISDOL) 50000 units CAPS capsule Take 50,000 Units by mouth every 7 (seven) days.     No current facility-administered medications for this visit.     Family History  Problem Relation Age of Onset  . Diabetes Mother   . Hypertension Mother     Review of Systems  Exam:   There were no vitals taken for this visit.  Weight change: @WEIGHTCHANGE @ Height:      Ht Readings from Last 3 Encounters:  07/08/18 5\' 3"  (1.6 m)  03/08/18 5' 2.75" (1.594 m)  01/05/18 5' 2.75" (1.594 m)    General appearance: alert, cooperative and appears stated age Head: Normocephalic, without obvious abnormality, atraumatic Neck: no adenopathy, supple, symmetrical, trachea midline and thyroid {CHL AMB PHY EX THYROID NORM  DEFAULT:[phone]::"normal to inspection and palpation"} Lungs: clear to auscultation bilaterally Cardiovascular: regular rate and rhythm Breasts: {Exam; breast:13139::"normal appearance, no masses or tenderness"} Abdomen: soft, non-tender; non distended,  no masses,  no organomegaly Extremities: extremities normal, atraumatic, no cyanosis or edema Skin: Skin color, texture, turgor normal. No rashes or lesions Lymph nodes: Cervical, supraclavicular, and axillary nodes normal. No abnormal inguinal nodes palpated Neurologic: Grossly normal   Pelvic: External genitalia:  no lesions              Urethra:  normal appearing urethra with no masses, tenderness or lesions              Bartholins and Skenes: normal                 Vagina: normal appearing vagina with normal color and discharge, no lesions              Cervix: {CHL AMB PHY EX CERVIX NORM DEFAULT:[phone]::"no lesions"}               Bimanual Exam:  Uterus:  {CHL AMB PHY EX UTERUS NORM DEFAULT:[id number]::"normal size, contour, position, consistency, mobility, non-tender"}              Adnexa: {CHL AMB PHY EX ADNEXA NO MASS [phone]::"no mass, fullness, tenderness"}               Rectovaginal:  Confirms               Anus:  normal sphincter tone, no lesions  Chaperone was present for exam.  A:  Well Woman with normal exam  P:

## 2019-01-31 ENCOUNTER — Ambulatory Visit: Payer: BC Managed Care – PPO | Admitting: Obstetrics and Gynecology

## 2019-02-08 ENCOUNTER — Ambulatory Visit: Payer: BC Managed Care – PPO | Admitting: Obstetrics and Gynecology

## 2019-02-16 NOTE — Progress Notes (Signed)
28 y.o. G0P0000 married Unavailable Not Hispanic or Latino female here for annual exam.  She is able to be sexually active now without pain. Considering pregnancy in the last year. No family history of genetic or chromosomal abnormalities.   She c/o vulvar itching, odor and white vaginal discharge since March, intermittent.   Period Cycle (Days): 28 Period Duration (Days): 6 days Period Pattern: Regular Menstrual Flow: Light, Moderate Menstrual Control: Thin pad Menstrual Control Change Freq (Hours): changes pad twice a day Dysmenorrhea: None   H/O pre-diabetes and elevated lipids, followed by primary  Patient's last menstrual period was 01/26/2019 (exact date).          Sexually active: Yes.    The current method of family planning is none.    Exercising: Yes.    walking Smoker:  no  Health Maintenance: Pap:  01/05/2018 WNL  History of abnormal Pap:  No MMG:  n/a Colonoscopy:  n/a BMD:   n/a TDaP:  01/05/2018 Gardasil: Completed all 3   reports that she has never smoked. She has never used smokeless tobacco. She reports that she does not drink alcohol or use drugs. 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 Husband is also in IT.   Past Medical History:  Diagnosis Date  . Asthma   . Hypercholesteremia    --monitored by diet  . Pre-diabetes   . Vitamin D deficiency     History reviewed. No pertinent surgical history.  Current Outpatient Medications  Medication Sig Dispense Refill  . vitamin B-12 (CYANOCOBALAMIN) 1000 MCG tablet Take 1,000 mcg by mouth daily.    . Vitamin D, Ergocalciferol, (DRISDOL) 50000 units CAPS capsule Take 50,000 Units by mouth every 7 (seven) days.     No current facility-administered medications for this visit.     Family History  Problem Relation Age of Onset  . Diabetes Mother   . Hypertension Mother     Review of Systems  Constitutional: Negative.   HENT: Negative.   Eyes: Negative.   Respiratory: Negative.   Cardiovascular: Negative.    Gastrointestinal: Negative.   Endocrine: Negative.   Genitourinary: Negative.   Musculoskeletal: Negative.   Skin: Negative.   Allergic/Immunologic: Negative.   Neurological: Negative.   Hematological: Negative.   Psychiatric/Behavioral: Negative.     Exam:   BP 118/84 (BP Location: Right Arm, Patient Position: Sitting, Cuff Size: Normal)   Temp 97.9 F (36.6 C) (Skin)   Ht 5' 2.6" (1.59 m)   Wt 154 lb (69.9 kg)   LMP 01/26/2019 (Exact Date)   BMI 27.63 kg/m   Weight change: @WEIGHTCHANGE @ Height:   Height: 5' 2.6" (159 cm)  Ht Readings from Last 3 Encounters:  02/20/19 5' 2.6" (1.59 m)  07/08/18 5\' 3"  (1.6 m)  03/08/18 5' 2.75" (1.594 m)    General appearance: alert, cooperative and appears stated age Head: Normocephalic, without obvious abnormality, atraumatic Neck: no adenopathy, supple, symmetrical, trachea midline and thyroid normal to inspection and palpation Lungs: clear to auscultation bilaterally Cardiovascular: regular rate and rhythm Breasts: normal appearance, no masses or tenderness Abdomen: soft, non-tender; non distended,  no masses,  no organomegaly Extremities: extremities normal, atraumatic, no cyanosis or edema Skin: Skin color, texture, turgor normal. No rashes or lesions Lymph nodes: Cervical, supraclavicular, and axillary nodes normal. No abnormal inguinal nodes palpated Neurologic: Grossly normal   Pelvic: External genitalia:  no lesions, + erythema, + fissures              Urethra:  normal appearing  urethra with no masses, tenderness or lesions              Bartholins and Skenes: normal                 Vagina: erythematous appearing vagina with a slight increase in white vaginal d/c              Cervix: no lesions, only able to see anterior lip (patient uncomfortable with pediatric speculum)               Bimanual Exam:  Uterus:  no masses or tenderness, exam limited from patient tensing              Adnexa: no mass, fullness, tenderness                Rectovaginal: deferred  Chaperone was present for exam.  Wet prep: no clue, no trich, +++ wbc KOH: ++ yeast PH: 4.5   A:  Well Woman with normal exam  Considering pregnancy  H/O itching and vaginal discharge, yeast vaginitis  P:   Rubella antibody  She reports h/o varicella  Start PNV, information on preparing for pregnancy given  No pap this year  Discussed breast self exam  Discussed calcium and vit D intake  Treat with diflucan and steroid ointment

## 2019-02-20 ENCOUNTER — Other Ambulatory Visit: Payer: Self-pay

## 2019-02-20 ENCOUNTER — Encounter: Payer: Self-pay | Admitting: Obstetrics and Gynecology

## 2019-02-20 ENCOUNTER — Ambulatory Visit: Payer: BC Managed Care – PPO | Admitting: Obstetrics and Gynecology

## 2019-02-20 VITALS — BP 118/84 | HR 72 | Temp 97.9°F | Ht 62.6 in | Wt 154.0 lb

## 2019-02-20 DIAGNOSIS — Z01419 Encounter for gynecological examination (general) (routine) without abnormal findings: Secondary | ICD-10-CM | POA: Diagnosis not present

## 2019-02-20 DIAGNOSIS — N760 Acute vaginitis: Secondary | ICD-10-CM | POA: Diagnosis not present

## 2019-02-20 DIAGNOSIS — Z3009 Encounter for other general counseling and advice on contraception: Secondary | ICD-10-CM

## 2019-02-20 DIAGNOSIS — B3731 Acute candidiasis of vulva and vagina: Secondary | ICD-10-CM

## 2019-02-20 DIAGNOSIS — B373 Candidiasis of vulva and vagina: Secondary | ICD-10-CM

## 2019-02-20 MED ORDER — BETAMETHASONE VALERATE 0.1 % EX OINT: TOPICAL_OINTMENT | CUTANEOUS | 0 refills | Status: AC

## 2019-02-20 MED ORDER — FLUCONAZOLE 150 MG PO TABS: 150.0000 mg | ORAL_TABLET | Freq: Once | ORAL | 0 refills | Status: AC

## 2019-02-20 NOTE — Patient Instructions (Addendum)
Start taking prenatal vitamins  Preparing for Pregnancy If you are considering becoming pregnant, make an appointment to see your regular health care provider to learn how to prepare for a safe and healthy pregnancy (preconception care). During a preconception care visit, your health care provider will:  Do a complete physical exam, including a Pap test.  Take a complete medical history.  Give you information, answer your questions, and help you resolve problems. Preconception checklist Medical history  Tell your health care provider about any current or past medical conditions. Your pregnancy or your ability to become pregnant may be affected by chronic conditions, such as diabetes, chronic hypertension, and thyroid problems.  Include your family's medical history as well as your partner's medical history.  Tell your health care provider about any history of STIs (sexually transmitted infections).These can affect your pregnancy. In some cases, they can be passed to your baby. Discuss any concerns that you have about STIs.  If indicated, discuss the benefits of genetic testing. This testing will show whether there are any genetic conditions that may be passed from you or your partner to your baby.  Tell your health care provider about: ? Any problems you have had with conception or pregnancy. ? Any medicines you take. These include vitamins, herbal supplements, and over-the-counter medicines. ? Your history of immunizations. Discuss any vaccinations that you may need. Diet  Ask your health care provider what to include in a healthy diet that has a balance of nutrients. This is especially important when you are pregnant or preparing to become pregnant.  Ask your health care provider to help you reach a healthy weight before pregnancy. ? If you are overweight, you may be at higher risk for certain complications, such as high blood pressure, diabetes, and preterm birth. ? If you are  underweight, you are more likely to have a baby who has a low birth weight. Lifestyle, work, and home  Let your health care provider know: ? About any lifestyle habits that you have, such as alcohol use, drug use, or smoking. ? About recreational activities that may put you at risk during pregnancy, such as downhill skiing and certain exercise programs. ? Tell your health care provider about any international travel, especially any travel to places with an active [place] virus outbreak. ? About harmful substances that you may be exposed to at work or at home. These include chemicals, pesticides, radiation, or even litter boxes. ? If you do not feel safe at home. Mental health  Tell your health care provider about: ? Any history of mental health conditions, including feelings of depression, sadness, or anxiety. ? Any medicines that you take for a mental health condition. These include herbs and supplements. Home instructions to prepare for pregnancy Lifestyle   Eat a balanced diet. This includes fresh fruits and vegetables, whole grains, lean meats, low-fat dairy products, healthy fats, and foods that are high in fiber. Ask to meet with a nutritionist or registered dietitian for assistance with meal planning and goals.  Get regular exercise. Try to be active for at least 30 minutes a day on most days of the week. Ask your health care provider which activities are safe during pregnancy.  Do not use any products that contain nicotine or tobacco, such as cigarettes and e-cigarettes. If you need help quitting, ask your health care provider.  Do not drink alcohol.  Do not take illegal drugs.  Maintain a healthy weight. Ask your health care provider what weight range  is right for you. General instructions  Keep an accurate record of your menstrual periods. This makes it easier for your health care provider to determine your baby's due date.  Begin taking prenatal vitamins and folic acid  supplements daily as directed by your health care provider.  Manage any chronic conditions, such as high blood pressure and diabetes, as told by your health care provider. This is important. How do I know that I am pregnant? You may be pregnant if you have been sexually active and you miss your period. Symptoms of early pregnancy include:  Mild cramping.  Very light vaginal bleeding (spotting).  Feeling unusually tired.  Nausea and vomiting (morning sickness). If you have any of these symptoms and you suspect that you might be pregnant, you can take a home pregnancy test. These tests check for a hormone in your urine (human chorionic gonadotropin, or hCG). A woman's body begins to make this hormone during early pregnancy. These tests are very accurate. Wait until at least the first day after you miss your period to take one. If the test shows that you are pregnant (you get a positive result), call your health care provider to make an appointment for prenatal care. What should I do if I become pregnant?      Make an appointment with your health care provider as soon as you suspect you are pregnant.  Do not use any products that contain nicotine, such as cigarettes, chewing tobacco, and e-cigarettes. If you need help quitting, ask your health care provider.  Do not drink alcoholic beverages. Alcohol is related to a number of birth defects.  Avoid toxic odors and chemicals.  You may continue to have sexual intercourse if it does not cause pain or other problems, such as vaginal bleeding. This information is not intended to replace advice given to you by your health care provider. Make sure you discuss any questions you have with your health care provider. Document Released: 07/02/2008 Document Revised: 07/22/2017 Document Reviewed: 02/09/2016 Elsevier Patient Education  2020 [employer]   EXERCISE AND DIET:  We recommended that you start or continue a regular exercise program for  good health. Regular exercise means any activity that makes your heart beat faster and makes you sweat.  We recommend exercising at least 30 minutes per day at least 3 days a week, preferably 4 or 5.  We also recommend a diet low in fat and sugar.  Inactivity, poor dietary choices and obesity can cause diabetes, heart attack, stroke, and kidney damage, among others.    ALCOHOL AND SMOKING:  Women should limit their alcohol intake to no more than 7 drinks/beers/glasses of wine (combined, not each!) per week. Moderation of alcohol intake to this level decreases your risk of breast cancer and liver damage. And of course, no recreational drugs are part of a healthy lifestyle.  And absolutely no smoking or even second hand smoke. Most people know smoking can cause heart and lung diseases, but did you know it also contributes to weakening of your bones? Aging of your skin?  Yellowing of your teeth and nails?  CALCIUM AND VITAMIN D:  Adequate intake of calcium and Vitamin D are recommended.  The recommendations for exact amounts of these supplements seem to change often, but generally speaking 1,000 mg of calcium (between diet and supplement) and 800 units of Vitamin D per day seems prudent. Certain women may benefit from higher intake of Vitamin D.  If you are among these women, your  doctor will have told you during your visit.    PAP SMEARS:  Pap smears, to check for cervical cancer or precancers,  have traditionally been done yearly, although recent scientific advances have shown that most women can have pap smears less often.  However, every woman still should have a physical exam from her gynecologist every year. It will include a breast check, inspection of the vulva and vagina to check for abnormal growths or skin changes, a visual exam of the cervix, and then an exam to evaluate the size and shape of the uterus and ovaries.  And after 30 years of age, a rectal exam is indicated to check for rectal cancers. We  will also provide age appropriate advice regarding health maintenance, like when you should have certain vaccines, screening for sexually transmitted diseases, bone density testing, colonoscopy, mammograms, etc.   MAMMOGRAMS:  All women over 30 years old should have a yearly mammogram. Many facilities now offer a "3D" mammogram, which may cost around $50 extra out of pocket. If possible,  we recommend you accept the option to have the 3D mammogram performed.  It both reduces the number of women who will be called back for extra views which then turn out to be normal, and it is better than the routine mammogram at detecting truly abnormal areas.    COLON CANCER SCREENING: Now recommend starting at age 30. At this time colonoscopy is not covered for routine screening until 50. There are take home tests that can be done between 45-49.   COLONOSCOPY:  Colonoscopy to screen for colon cancer is recommended for all women at age 30.  We know, you hate the idea of the prep.  We agree, BUT, having colon cancer and not knowing it is worse!!  Colon cancer so often starts as a polyp that can be seen and removed at colonscopy, which can quite literally save your life!  And if your first colonoscopy is normal and you have no family history of colon cancer, most women don't have to have it again for 10 years.  Once every ten years, you can do something that may end up saving your life, right?  We will be happy to help you get it scheduled when you are ready.  Be sure to check your insurance coverage so you understand how much it will cost.  It may be covered as a preventative service at no cost, but you should check your particular policy.      Breast Self-Awareness Breast self-awareness means being familiar with how your breasts look and feel. It involves checking your breasts regularly and reporting any changes to your health care provider. Practicing breast self-awareness is important. A change in your breasts can be  a sign of a serious medical problem. Being familiar with how your breasts look and feel allows you to find any problems early, when treatment is more likely to be successful. All women should practice breast self-awareness, including women who have had breast implants. How to do a breast self-exam One way to learn what is normal for your breasts and whether your breasts are changing is to do a breast self-exam. To do a breast self-exam: Look for Changes  1. Remove all the clothing above your waist. 2. Stand in front of a mirror in a room with good lighting. 3. Put your hands on your hips. 4. Push your hands firmly downward. 5. Compare your breasts in the mirror. Look for differences between them (asymmetry), such as: ?  Differences in shape. ? Differences in size. ? Puckers, dips, and bumps in one breast and not the other. 6. Look at each breast for changes in your skin, such as: ? Redness. ? Scaly areas. 7. Look for changes in your nipples, such as: ? Discharge. ? Bleeding. ? Dimpling. ? Redness. ? A change in position. Feel for Changes Carefully feel your breasts for lumps and changes. It is best to do this while lying on your back on the floor and again while sitting or standing in the shower or tub with soapy water on your skin. Feel each breast in the following way:  Place the arm on the side of the breast you are examining above your head.  Feel your breast with the other hand.  Start in the nipple area and make  inch (2 cm) overlapping circles to feel your breast. Use the pads of your three middle fingers to do this. Apply light pressure, then medium pressure, then firm pressure. The light pressure will allow you to feel the tissue closest to the skin. The medium pressure will allow you to feel the tissue that is a little deeper. The firm pressure will allow you to feel the tissue close to the ribs.  Continue the overlapping circles, moving downward over the breast until you  feel your ribs below your breast.  Move one finger-width toward the center of the body. Continue to use the  inch (2 cm) overlapping circles to feel your breast as you move slowly up toward your collarbone.  Continue the up and down exam using all three pressures until you reach your armpit.  Write Down What You Find  Write down what is normal for each breast and any changes that you find. Keep a written record with breast changes or normal findings for each breast. By writing this information down, you do not need to depend only on memory for size, tenderness, or location. Write down where you are in your menstrual cycle, if you are still menstruating. If you are having trouble noticing differences in your breasts, do not get discouraged. With time you will become more familiar with the variations in your breasts and more comfortable with the exam. How often should I examine my breasts? Examine your breasts every month. If you are breastfeeding, the best time to examine your breasts is after a feeding or after using a breast pump. If you menstruate, the best time to examine your breasts is 5-7 days after your period is over. During your period, your breasts are lumpier, and it may be more difficult to notice changes. When should I see my health care provider? See your health care provider if you notice:  A change in shape or size of your breasts or nipples.  A change in the skin of your breast or nipples, such as a reddened or scaly area.  Unusual discharge from your nipples.  A lump or thick area that was not there before.  Pain in your breasts.  Anything that concerns you.   Vaginal Yeast infection, Adult  Vaginal yeast infection is a condition that causes vaginal discharge as well as soreness, swelling, and redness (inflammation) of the vagina. This is a common condition. Some women get this infection frequently. What are the causes? This condition is caused by a change in the  normal balance of the yeast (candida) and bacteria that live in the vagina. This change causes an overgrowth of yeast, which causes the inflammation. What increases the risk?  The condition is more likely to develop in women who:  Take antibiotic medicines.  Have diabetes.  Take birth control pills.  Are pregnant.  Douche often.  Have a weak body defense system (immune system).  Have been taking steroid medicines for a long time.  Frequently wear tight clothing. What are the signs or symptoms? Symptoms of this condition include:  White, thick, creamy vaginal discharge.  Swelling, itching, redness, and irritation of the vagina. The lips of the vagina (vulva) may be affected as well.  Pain or a burning feeling while urinating.  Pain during sex. How is this diagnosed? This condition is diagnosed based on:  Your medical history.  A physical exam.  A pelvic exam. Your health care provider will examine a sample of your vaginal discharge under a microscope. Your health care provider may send this sample for testing to confirm the diagnosis. How is this treated? This condition is treated with medicine. Medicines may be over-the-counter or prescription. You may be told to use one or more of the following:  Medicine that is taken by mouth (orally).  Medicine that is applied as a cream (topically).  Medicine that is inserted directly into the vagina (suppository). Follow these instructions at home:  Lifestyle  Do not have sex until your health care provider approves. Tell your sex partner that you have a yeast infection. That person should go to his or her health care provider and ask if they should also be treated.  Do not wear tight clothes, such as pantyhose or tight pants.  Wear breathable cotton underwear. General instructions  Take or apply over-the-counter and prescription medicines only as told by your health care provider.  Eat more yogurt. This may help to keep  your yeast infection from returning.  Do not use tampons until your health care provider approves.  Try taking a sitz bath to help with discomfort. This is a warm water bath that is taken while you are sitting down. The water should only come up to your hips and should cover your buttocks. Do this 3-4 times per day or as told by your health care provider.  Do not douche.  If you have diabetes, keep your blood sugar levels under control.  Keep all follow-up visits as told by your health care provider. This is important. Contact a health care provider if:  You have a fever.  Your symptoms go away and then return.  Your symptoms do not get better with treatment.  Your symptoms get worse.  You have new symptoms.  You develop blisters in or around your vagina.  You have blood coming from your vagina and it is not your menstrual period.  You develop pain in your abdomen. Summary  Vaginal yeast infection is a condition that causes discharge as well as soreness, swelling, and redness (inflammation) of the vagina.  This condition is treated with medicine. Medicines may be over-the-counter or prescription.  Take or apply over-the-counter and prescription medicines only as told by your health care provider.  Do not douche. Do not have sex or use tampons until your health care provider approves.  Contact a health care provider if your symptoms do not get better with treatment or your symptoms go away and then return. This information is not intended to replace advice given to you by your health care provider. Make sure you discuss any questions you have with your health care provider. Document Released: 04/29/2005 Document Revised: 12/06/2017 Document Reviewed: 12/06/2017 Elsevier Patient Education  2020 [employer]   

## 2019-02-22 LAB — RUBELLA SCREEN: Rubella Antibodies, IGG: 14.2 index (ref >0.99)

## 2019-03-02 ENCOUNTER — Ambulatory Visit: Payer: BC Managed Care – PPO | Admitting: Obstetrics and Gynecology
